# Patient Record
Sex: Male | Born: 1990 | Race: White | Hispanic: No | Marital: Single | Smoking: Never smoker
Health system: Southern US, Community
[De-identification: ages and names within clinical notes are randomized; demographics above are authoritative.]

---
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
## 2016-12-28 ENCOUNTER — Emergency Department (HOSPITAL_COMMUNITY)
Admission: EM | Admit: 2016-12-28 | Discharge: 2016-12-28 | Disposition: A | Discharge status: Home / Self Care | Payer: Medicaid Other | Attending: Emergency Medicine | Admitting: Emergency Medicine

## 2016-12-28 ENCOUNTER — Encounter (HOSPITAL_COMMUNITY): Payer: Self-pay | Admitting: Emergency Medicine

## 2016-12-28 DIAGNOSIS — K625 Hemorrhage of anus and rectum: Secondary | ICD-10-CM | POA: Diagnosis present

## 2016-12-28 LAB — CBC
HCT: 51.2 % (ref 39.0–52.0)
Hemoglobin: 17.1 g/dL — ABNORMAL HIGH (ref 13.0–17.0)
MCH: 27 pg (ref 26.0–34.0)
MCHC: 33.4 g/dL (ref 30.0–36.0)
MCV: 80.8 fL (ref 78.0–100.0)
Platelets: 249 10*3/uL (ref 150–400)
RBC: 6.34 MIL/uL — ABNORMAL HIGH (ref 4.22–5.81)
RDW: 14.8 % (ref 11.5–15.5)
WBC: 10 10*3/uL (ref 4.0–10.5)

## 2016-12-28 LAB — COMPREHENSIVE METABOLIC PANEL
ALT: 25 U/L (ref 17–63)
AST: 30 U/L (ref 15–41)
Albumin: 4.8 g/dL (ref 3.5–5.0)
Alkaline Phosphatase: 71 U/L (ref 38–126)
Anion gap: 12 (ref 5–15)
BUN: 9 mg/dL (ref 6–20)
CO2: 22 mmol/L (ref 22–32)
Calcium: 9.9 mg/dL (ref 8.9–10.3)
Chloride: 102 mmol/L (ref 101–111)
Creatinine, Ser: 0.8 mg/dL (ref 0.61–1.24)
GFR calc Af Amer: >60 mL/min (ref >60)
GFR calc non Af Amer: >60 mL/min (ref >60)
Glucose, Bld: 104 mg/dL — ABNORMAL HIGH (ref 65–99)
Potassium: 3.8 mmol/L (ref 3.5–5.1)
Sodium: 136 mmol/L (ref 135–145)
Total Bilirubin: 2.2 mg/dL — ABNORMAL HIGH (ref 0.3–1.2)
Total Protein: 8.2 g/dL — ABNORMAL HIGH (ref 6.5–8.1)

## 2016-12-28 NOTE — Discharge Instructions (Signed)
I suspect the blood you are seeing if from your colon or rectum. It may be from internal hemorrhoids because you aren't having much rectal pain. External hemorrhoids are generally easily visible and uncomfortable/painful. I don't see any on your exam today. Internal hemorrhoids are a little higher up and painless. Eat plenty of fiber and stay hydrated. If this continues much longer then I would advise seeing a gastroenterologist.

## 2016-12-28 NOTE — ED Triage Notes (Signed)
Pt is deaf and arrives with sign language interpretor. Pt states he has been having on and off bright reb blood in his stool for the last 1 month. Pt states it is normally a small amount. Pt denies any pain.

## 2016-12-28 NOTE — ED Provider Notes (Signed)
[facility]EMERGENCY DEPT Provider Note    By signing my name below, I, Earmon Phoenix, attest that this documentation has been prepared under the direction and in the presence of Raeford Razor, MD. Electronically Signed: Earmon Phoenix, ED Scribe. 12/28/16. 11:01 AM.    History   Chief Complaint Chief Complaint  Patient presents with  . Rectal Bleeding   The history is provided by the patient and medical records. No language interpreter was used.    Eugene Welch is a 26 y.o. male who presents to the Emergency Department complaining of intermittent rectal bleeding with bowel movements that has been ongoing for the past two months. He states the blood is only intermittently bright red and is usually only present with wiping after a bowel movement. He reports associated intermittent abdominal pain and diarrhea. He reports low back pain for the past several months but is unsure if it is related as there is no correlation between the pain and increased bleeding. He states he feels "weird" today which prompted his ED visit but cannot explain exactly what he means by that. Pt states he has noticed hemorrhoids in the past but has not noticed anything recently. He has not taken anything for his symptoms. Bowel movements increase the bleeding. He denies alleviating factors. He denies rectal pain, urinary complaints, black or tarry stools. He denies straining with bowel movements.   History reviewed. No pertinent past medical history.  There are no active problems to display for this patient.   No past surgical history on file.     Home Medications    Prior to Admission medications   Not on File    Family History No family history on file.  Social History Social History  Substance Use Topics  . Smoking status: Not on file  . Smokeless tobacco: Not on file  . Alcohol use Not on file     Allergies   Patient has no known allergies.   Review of Systems Review of Systems All  other systems reviewed and are negative for acute change except as noted in the HPI.   Physical Exam Updated Vital Signs BP 123/87 (BP Location: Left Arm)   Pulse 72   Temp 98 F (36.7 C) (Oral)   Resp 17   SpO2 95%   Physical Exam  Constitutional: He is oriented to person, place, and time. He appears well-developed and well-nourished. No distress.  HENT:  Head: Normocephalic and atraumatic.  Eyes: EOM are normal.  Neck: Normal range of motion.  Cardiovascular: Normal rate, regular rhythm and normal heart sounds.   Pulmonary/Chest: Effort normal and breath sounds normal.  Abdominal: Soft. He exhibits no distension. There is no tenderness.  Genitourinary: Rectal exam shows no external hemorrhoid.  Genitourinary Comments: No concerning lesions noted.  Musculoskeletal: Normal range of motion.  Neurological: He is alert and oriented to person, place, and time.  Skin: Skin is warm and dry.  Psychiatric: He has a normal mood and affect.  Nursing note and vitals reviewed.    ED Treatments / Results  DIAGNOSTIC STUDIES: Oxygen Saturation is 95% on RA, adequate by my interpretation.   COORDINATION OF CARE: 11:00 AM- Informed pt of normal labs. Will perform rectal exam; pt declines DRE. Instructed pt to increase fiber intake and stay well hydrated. Recommended seeing a gastroenterologist. Recommended NSAID for back pain. Pt verbalizes understanding and agrees to plan.  Medications - No data to display  Labs (all labs ordered are listed, but only abnormal results are displayed)  Labs Reviewed  COMPREHENSIVE METABOLIC PANEL - Abnormal; Notable for the following:       Result Value   Glucose, Bld 104 (*)    Total Protein 8.2 (*)    Total Bilirubin 2.2 (*)    All other components within normal limits  CBC - Abnormal; Notable for the following:    RBC 6.34 (*)    Hemoglobin 17.1 (*)    All other components within normal limits    EKG  EKG Interpretation None        Radiology No results found.  Procedures Procedures (including critical care time)  Medications Ordered in ED Medications - No data to display   Initial Impression / Assessment and Plan / ED Course  I have reviewed the triage vital signs and the nursing notes.  Pertinent labs & imaging results that were available during my care of the patient were reviewed by me and considered in my medical decision making (see chart for details).     Will dose with QTC appeared to be pretty unremarkable. No blood thinners. Hemodynamically stable. Continue colonoscopies persist. Return precautions discussed.  Final Clinical Impressions(s) / ED Diagnoses   Final diagnoses:  Rectal bleeding    New Prescriptions New Prescriptions   No medications on file     I personally preformed the services scribed in my presence. The recorded information has been reviewed is accurate. Raeford Razor, MD.      Raeford Razor, MD 01/15/17 [phone]

## 2019-04-06 ENCOUNTER — Other Ambulatory Visit: Payer: Self-pay

## 2019-04-06 DIAGNOSIS — Z20822 Contact with and (suspected) exposure to covid-19: Secondary | ICD-10-CM

## 2019-04-07 LAB — NOVEL CORONAVIRUS, NAA: SARS-CoV-2, NAA: NOT DETECTED

## 2023-11-27 ENCOUNTER — Encounter: Payer: Self-pay | Admitting: Emergency Medicine

## 2023-11-27 ENCOUNTER — Other Ambulatory Visit: Payer: Self-pay

## 2023-11-27 ENCOUNTER — Telehealth: Admitting: Physician Assistant

## 2023-11-27 ENCOUNTER — Ambulatory Visit
Admission: EM | Admit: 2023-11-27 | Discharge: 2023-11-27 | Disposition: A | Discharge status: Home / Self Care | Attending: Family Medicine | Admitting: Family Medicine

## 2023-11-27 DIAGNOSIS — S39012A Strain of muscle, fascia and tendon of lower back, initial encounter: Secondary | ICD-10-CM

## 2023-11-27 DIAGNOSIS — M549 Dorsalgia, unspecified: Secondary | ICD-10-CM

## 2023-11-27 MED ORDER — NAPROXEN 500 MG PO TABS: 500.0000 mg | ORAL_TABLET | Freq: Two times a day (BID) | ORAL | 0 refills | Status: AC | PRN

## 2023-11-27 MED ORDER — TIZANIDINE HCL 4 MG PO CAPS: 4.0000 mg | ORAL_CAPSULE | Freq: Three times a day (TID) | ORAL | 0 refills | Status: AC | PRN

## 2023-11-27 NOTE — ED Provider Notes (Signed)
 [facility]    CSN: [id number] Arrival date & time: 11/27/23  1135      History   Chief Complaint Chief Complaint  Patient presents with   Back Pain    HPI Eugene Welch is a 33 y.o. male.   Medical interpreter declined today, patient wishes for significant other who is with him today to interpret ASL for him.  He states he was having 3-day history of left lower back pain worse with standing.  Denies radiation of pain down legs, weakness, numbness, tingling, bowel or bladder incontinence, saddle anesthesia, known injury to the area.  So far has tried a muscle relaxer which did not really help per patient.    History reviewed. No pertinent past medical history.  There are no active problems to display for this patient.   History reviewed. No pertinent surgical history.     Home Medications    Prior to Admission medications   Medication Sig Start Date End Date Taking? Authorizing Provider  naproxen  (NAPROSYN ) 500 MG tablet Take 1 tablet (500 mg total) by mouth 2 (two) times daily as needed. 11/27/23  Yes Stuart Vernell Norris, PA-C  tiZANidine  (ZANAFLEX ) 4 MG capsule Take 1 capsule (4 mg total) by mouth 3 (three) times daily as needed for muscle spasms. Do not drink alcohol or drive while taking this medication.  May cause drowsiness. 11/27/23  Yes Stuart Vernell Norris, PA-C    Family History History reviewed. No pertinent family history.  Social History Social History   Tobacco Use   Smoking status: Never   Smokeless tobacco: Never     Allergies   Patient has no known allergies.   Review of Systems Review of Systems Per HPI  Physical Exam Triage Vital Signs ED Triage Vitals  Encounter Vitals Group     BP 11/27/23 1227 127/79     Girls Systolic BP Percentile --      Girls Diastolic BP Percentile --      Boys Systolic BP Percentile --      Boys Diastolic BP Percentile --      Pulse Rate 11/27/23 1227 84     Resp 11/27/23 1227 20      Temp 11/27/23 1227 98.4 F (36.9 C)     Temp Source 11/27/23 1227 Oral     SpO2 11/27/23 1227 95 %     Weight --      Height --      Head Circumference --      Peak Flow --      Pain Score 11/27/23 1226 9     Pain Loc --      Pain Education --      Exclude from Growth Chart --    No data found.  Updated Vital Signs BP 127/79 (BP Location: Right Arm)   Pulse 84   Temp 98.4 F (36.9 C) (Oral)   Resp 20   SpO2 95%   Visual Acuity Right Eye Distance:   Left Eye Distance:   Bilateral Distance:    Right Eye Near:   Left Eye Near:    Bilateral Near:     Physical Exam Vitals and nursing note reviewed.  Constitutional:      Appearance: Normal appearance.  HENT:     Head: Atraumatic.  Eyes:     Extraocular Movements: Extraocular movements intact.     Conjunctiva/sclera: Conjunctivae normal.  Cardiovascular:     Rate and Rhythm: Normal rate and regular rhythm.  Pulmonary:  Effort: Pulmonary effort is normal.     Breath sounds: Normal breath sounds.  Musculoskeletal:        General: Tenderness present. No swelling or signs of injury. Normal range of motion.     Cervical back: Normal range of motion and neck supple.     Comments: No midline spinal tenderness to palpation diffusely.  Negative straight leg raise bilateral lower extremities.  Normal gait and range of motion.  Left lumbar musculature tender to palpation  Skin:    General: Skin is warm and dry.  Neurological:     General: No focal deficit present.     Mental Status: He is oriented to person, place, and time.     Motor: No weakness.     Gait: Gait normal.     Comments: Bilateral lower extremities neurovascularly intact  Psychiatric:        Mood and Affect: Mood normal.        Thought Content: Thought content normal.        Judgment: Judgment normal.      UC Treatments / Results  Labs (all labs ordered are listed, but only abnormal results are displayed) Labs Reviewed - No data to  display  EKG   Radiology No results found.  Procedures Procedures (including critical care time)  Medications Ordered in UC Medications - No data to display  Initial Impression / Assessment and Plan / UC Course  I have reviewed the triage vital signs and the nursing notes.  Pertinent labs & imaging results that were available during my care of the patient were reviewed by me and considered in my medical decision making (see chart for details).     No red flag findings on exam today, suspect lumbar strain.  Treat with naproxen , Zanaflex , heat,/, stretches.  Return for worsening symptoms.  Final Clinical Impressions(s) / UC Diagnoses   Final diagnoses:  Strain of lumbar region, initial encounter   Discharge Instructions   None    ED Prescriptions     Medication Sig Dispense Auth. Provider   naproxen  (NAPROSYN ) 500 MG tablet Take 1 tablet (500 mg total) by mouth 2 (two) times daily as needed. 20 tablet Stuart Vernell Norris, PA-C   tiZANidine  (ZANAFLEX ) 4 MG capsule Take 1 capsule (4 mg total) by mouth 3 (three) times daily as needed for muscle spasms. Do not drink alcohol or drive while taking this medication.  May cause drowsiness. 15 capsule Stuart Vernell Norris, [place]      PDMP not reviewed this encounter.   Stuart Vernell Norris, [place] 11/27/23 1324

## 2023-11-27 NOTE — Progress Notes (Signed)
  Because of severity of back pain (10/10), I feel your condition warrants further evaluation and I recommend that you be seen in a face-to-face visit.   NOTE: There will be NO CHARGE for this E-Visit   If you are having a true medical emergency, please call 911.     For an urgent face to face visit, [place] has multiple urgent care centers for your convenience.  Click the link below for the full list of locations and hours, walk-in wait times, appointment scheduling options and driving directions:  Urgent Care - [place], [place], [place], [place], [place], [place]  [place]     Your MyChart E-visit questionnaire answers were reviewed by a board certified advanced clinical practitioner to complete your personal care plan based on your specific symptoms.    Thank you for using e-Visits.

## 2023-11-27 NOTE — ED Triage Notes (Addendum)
 Pt partner was used for interpreter, per pt permission. Interpreter tablet in use.  Pt reports lower back pain x3 days. Denies any known injury, gi/gu symptoms.   Pt declined interpreter tablet once became available.
